# Patient Record
Sex: Male | Born: 1975 | Race: White | Hispanic: No | Marital: Married | Smoking: Current every day smoker
Health system: Southern US, Community
[De-identification: ages and names within clinical notes are randomized; demographics above are authoritative.]

## PROBLEM LIST (undated history)

## (undated) DIAGNOSIS — M5136 Other intervertebral disc degeneration, lumbar region: Secondary | ICD-10-CM

## (undated) DIAGNOSIS — M5126 Other intervertebral disc displacement, lumbar region: Secondary | ICD-10-CM

## (undated) DIAGNOSIS — M51369 Other intervertebral disc degeneration, lumbar region without mention of lumbar back pain or lower extremity pain: Secondary | ICD-10-CM

## (undated) HISTORY — PX: SPINAL CORD STIMULATOR IMPLANT: SHX2422

---

## 2005-02-11 ENCOUNTER — Emergency Department (HOSPITAL_COMMUNITY): Admission: EM | Admit: 2005-02-11 | Discharge: 2005-02-11 | Payer: Self-pay | Admitting: Emergency Medicine

## 2005-03-09 ENCOUNTER — Emergency Department (HOSPITAL_COMMUNITY): Admission: EM | Admit: 2005-03-09 | Discharge: 2005-03-09 | Payer: Self-pay | Admitting: Emergency Medicine

## 2005-03-11 ENCOUNTER — Emergency Department (HOSPITAL_COMMUNITY): Admission: EM | Admit: 2005-03-11 | Discharge: 2005-03-11 | Payer: Self-pay | Admitting: Emergency Medicine

## 2009-05-13 ENCOUNTER — Ambulatory Visit (HOSPITAL_COMMUNITY): Admission: RE | Admit: 2009-05-13 | Discharge: 2009-05-13 | Payer: Self-pay | Admitting: Family Medicine

## 2009-09-30 ENCOUNTER — Encounter: Admission: RE | Admit: 2009-09-30 | Discharge: 2009-09-30 | Payer: Self-pay | Admitting: Neurosurgery

## 2010-07-30 ENCOUNTER — Emergency Department (HOSPITAL_COMMUNITY)
Admission: EM | Admit: 2010-07-30 | Discharge: 2010-07-30 | Payer: Self-pay | Source: Home / Self Care | Admitting: Emergency Medicine

## 2012-04-07 ENCOUNTER — Emergency Department (HOSPITAL_COMMUNITY)
Admission: EM | Admit: 2012-04-07 | Discharge: 2012-04-07 | Disposition: A | Discharge status: Home / Self Care | Payer: Medicare Other | Attending: Emergency Medicine | Admitting: Emergency Medicine

## 2012-04-07 ENCOUNTER — Encounter (HOSPITAL_COMMUNITY): Payer: Self-pay | Admitting: *Deleted

## 2012-04-07 DIAGNOSIS — F17200 Nicotine dependence, unspecified, uncomplicated: Secondary | ICD-10-CM | POA: Insufficient documentation

## 2012-04-07 DIAGNOSIS — L255 Unspecified contact dermatitis due to plants, except food: Secondary | ICD-10-CM

## 2012-04-07 DIAGNOSIS — R21 Rash and other nonspecific skin eruption: Secondary | ICD-10-CM | POA: Insufficient documentation

## 2012-04-07 MED ORDER — PREDNISONE 10 MG PO TABS: ORAL_TABLET | ORAL | Status: DC

## 2012-04-07 MED ORDER — DEXAMETHASONE SODIUM PHOSPHATE 4 MG/ML IJ SOLN
10.0000 mg | Freq: Once | INTRAMUSCULAR | Status: AC
  Administered 2012-04-07: 10 mg via INTRAMUSCULAR
  Filled 2012-04-07: qty 3

## 2012-04-07 MED ORDER — DEXAMETHASONE SODIUM PHOSPHATE 4 MG/ML IJ SOLN: 10.0000 mg | Freq: Once | INTRAMUSCULAR | Status: DC

## 2012-04-07 MED ORDER — DIPHENHYDRAMINE HCL 25 MG PO CAPS
25.0000 mg | ORAL_CAPSULE | Freq: Once | ORAL | Status: AC
  Administered 2012-04-07: 25 mg via ORAL
  Filled 2012-04-07: qty 1

## 2012-04-07 NOTE — ED Notes (Signed)
Pt cleaned out flower bed yesterday, now has rash on both arms and blisters in  Between fingers on left hand

## 2012-04-07 NOTE — ED Provider Notes (Signed)
History     CSN: [id number]  Arrival date & time 04/07/12  2028   First MD Initiated Contact with Patient 04/07/12 2034      Chief Complaint  Patient presents with  . Rash  . Poison Ivy    (Consider location/radiation/quality/duration/timing/severity/associated sxs/prior treatment) HPI Comments: Patient c/o blisters and itching rash to his bilateral arms and fingers of the left hand.  States he was working in his flower bed yesterday prior to onset of the symptoms and believes he may have been in contact with poison oak.  States he has had similar symptoms before with exposure to poison oak/ivy and states he "ususally has big blisters" form.  He has applied calamine lotion w/o improvement.  He denies fever, swelling of the face, lips, or tongue.  He also denies difficulty swallowing or breathing.  Patient is a 36 y.o. male presenting with rash and Poison Ivy. The history is provided by the patient.  Rash  This is a new problem. The current episode started yesterday. The problem has been gradually worsening. The problem is associated with plant contact. There has been no fever. The rash is present on the right arm, left arm and left hand. The patient is experiencing no pain. Associated symptoms include blisters and itching. Pertinent negatives include no weeping. He has tried anti-itch cream for the symptoms. The treatment provided no relief.  Poison Ivy Associated symptoms include a rash. Pertinent negatives include no arthralgias, chest pain, headaches, neck pain or numbness.    History reviewed. No pertinent past medical history.  Past Surgical History  Procedure Date  . Spinal cord stimulator implant     History reviewed. No pertinent family history.  History  Substance Use Topics  . Smoking status: Current Every Day Smoker -- 0.5 packs/day    Types: Cigarettes  . Smokeless tobacco: Not on file  . Alcohol Use: No      Review of Systems  Constitutional: Negative for  activity change and appetite change.  HENT: Negative for facial swelling and neck pain.   Eyes: Negative for visual disturbance.  Respiratory: Negative for chest tightness and shortness of breath.   Cardiovascular: Negative for chest pain.  Musculoskeletal: Negative for arthralgias.  Skin: Positive for itching and rash.  Neurological: Negative for dizziness, facial asymmetry, light-headedness, numbness and headaches.  Psychiatric/Behavioral: Negative for confusion and decreased concentration.  All other systems reviewed and are negative.    Allergies  Review of patient's allergies indicates no known allergies.  Home Medications  No current outpatient prescriptions on file.  BP 128/84  Pulse 83  Temp 98.5 F (36.9 C)  Resp 20  Wt 154 lb (69.854 kg)  SpO2 99%  Physical Exam  Nursing note and vitals reviewed. Constitutional: He is oriented to person, place, and time. He appears well-developed and well-nourished. No distress.  HENT:  Head: Normocephalic and atraumatic.  Mouth/Throat: Oropharynx is clear and moist.  Eyes: EOM are normal. Pupils are equal, round, and reactive to light.  Neck: Neck supple.  Cardiovascular: Normal rate, regular rhythm, normal heart sounds and intact distal pulses.   No murmur heard. Pulmonary/Chest: Effort normal and breath sounds normal.  Musculoskeletal: Normal range of motion.  Lymphadenopathy:    He has no cervical adenopathy.  Neurological: He is alert and oriented to person, place, and time. He exhibits normal muscle tone. Coordination normal.  Skin: Skin is warm and intact. Rash noted. Rash is vesicular.          Two dime  sized bulla to the  web space of the index and middle fingers.  Several smaller vesicles to the forehead, left forearm and right wrist.  Vesicles are in a linear pattern on the left forearm.    ED Course  Procedures (including critical care time)  Labs Reviewed - No data to display No results found.    Left  fingers were wrapped with gauze for comfort.   MDM   Vesicles to the bilateral UE's in a linear pattern.  likely plant dermatitis.  No fever,pain, myalgias or other associated symptoms to suggest varicella, symptoms are bilateral and unlikely related to zoster.    The patient appears reasonably screened and/or stabilized for discharge and I doubt any other medical condition or other [facility] requiring further screening, evaluation, or treatment in the ED at this time prior to discharge.   Prescribed: Prednisone taper.       Katia Hannen L. [place], [place] 04/07/12 2131

## 2012-04-07 NOTE — ED Notes (Signed)
Pt noticed blistering and allergic reaction to L hand and arm yesterday, Blistering is noted between fingers mostly on L hand. Some smaller areas noticed on arms bilaterally and forehead. Airway clear, lungs clear, denies diff breathing.

## 2012-04-07 NOTE — ED Provider Notes (Signed)
Shelda Jakes, MD   Medical screening examination/treatment/procedure(s) were performed by non-physician practitioner and as supervising physician I was immediately available for consultation/collaboration.  Shelda Jakes, MD 04/07/12 [phone]

## 2015-11-06 ENCOUNTER — Emergency Department (HOSPITAL_COMMUNITY)
Admission: EM | Admit: 2015-11-06 | Discharge: 2015-11-06 | Disposition: A | Discharge status: Home / Self Care | Payer: Medicare Other | Attending: Emergency Medicine | Admitting: Emergency Medicine

## 2015-11-06 ENCOUNTER — Encounter (HOSPITAL_COMMUNITY): Payer: Self-pay | Admitting: *Deleted

## 2015-11-06 ENCOUNTER — Emergency Department (HOSPITAL_COMMUNITY): Payer: Medicare Other

## 2015-11-06 DIAGNOSIS — M549 Dorsalgia, unspecified: Secondary | ICD-10-CM | POA: Diagnosis not present

## 2015-11-06 DIAGNOSIS — M25512 Pain in left shoulder: Secondary | ICD-10-CM | POA: Insufficient documentation

## 2015-11-06 DIAGNOSIS — F17210 Nicotine dependence, cigarettes, uncomplicated: Secondary | ICD-10-CM | POA: Insufficient documentation

## 2015-11-06 DIAGNOSIS — Z79899 Other long term (current) drug therapy: Secondary | ICD-10-CM | POA: Insufficient documentation

## 2015-11-06 DIAGNOSIS — S46819A Strain of other muscles, fascia and tendons at shoulder and upper arm level, unspecified arm, initial encounter: Secondary | ICD-10-CM

## 2015-11-06 DIAGNOSIS — M25511 Pain in right shoulder: Secondary | ICD-10-CM | POA: Diagnosis not present

## 2015-11-06 HISTORY — DX: Other intervertebral disc displacement, lumbar region: M51.26

## 2015-11-06 HISTORY — DX: Other intervertebral disc degeneration, lumbar region: M51.36

## 2015-11-06 HISTORY — DX: Other intervertebral disc degeneration, lumbar region without mention of lumbar back pain or lower extremity pain: M51.369

## 2015-11-06 MED ORDER — DIAZEPAM 5 MG PO TABS
10.0000 mg | ORAL_TABLET | Freq: Once | ORAL | Status: AC
  Administered 2015-11-06: 10 mg via ORAL
  Filled 2015-11-06: qty 2

## 2015-11-06 MED ORDER — CYCLOBENZAPRINE HCL 10 MG PO TABS: 10.0000 mg | ORAL_TABLET | Freq: Three times a day (TID) | ORAL | Status: AC | PRN

## 2015-11-06 MED ORDER — KETOROLAC TROMETHAMINE 10 MG PO TABS
10.0000 mg | ORAL_TABLET | Freq: Once | ORAL | Status: AC
  Administered 2015-11-06: 10 mg via ORAL
  Filled 2015-11-06: qty 1

## 2015-11-06 MED ORDER — ACETAMINOPHEN 325 MG PO TABS
650.0000 mg | ORAL_TABLET | Freq: Once | ORAL | Status: AC
  Administered 2015-11-06: 650 mg via ORAL
  Filled 2015-11-06: qty 2

## 2015-11-06 MED ORDER — DICLOFENAC SODIUM 75 MG PO TBEC: 75.0000 mg | DELAYED_RELEASE_TABLET | Freq: Two times a day (BID) | ORAL | Status: AC
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
## 2024-06-17 ENCOUNTER — Ambulatory Visit: Admitting: Student
